# Patient Record
Sex: Female | Born: 1955 | Race: White | Hispanic: No | Marital: Married | Smoking: Current every day smoker
Health system: Southern US, Community
[De-identification: ages and names within clinical notes are randomized; demographics above are authoritative.]

## PROBLEM LIST (undated history)

## (undated) DIAGNOSIS — Q210 Ventricular septal defect: Secondary | ICD-10-CM

## (undated) DIAGNOSIS — J449 Chronic obstructive pulmonary disease, unspecified: Secondary | ICD-10-CM

## (undated) DIAGNOSIS — I480 Paroxysmal atrial fibrillation: Secondary | ICD-10-CM

## (undated) DIAGNOSIS — I10 Essential (primary) hypertension: Secondary | ICD-10-CM

## (undated) DIAGNOSIS — Z720 Tobacco use: Secondary | ICD-10-CM

## (undated) HISTORY — DX: Paroxysmal atrial fibrillation: I48.0

## (undated) HISTORY — DX: Tobacco use: Z72.0

## (undated) HISTORY — DX: Ventricular septal defect: Q21.0

## (undated) HISTORY — DX: Essential (primary) hypertension: I10

---

## 1997-12-09 HISTORY — PX: ABDOMINAL HYSTERECTOMY: SHX81

## 2004-08-03 DIAGNOSIS — K2100 Gastro-esophageal reflux disease with esophagitis, without bleeding: Secondary | ICD-10-CM | POA: Insufficient documentation

## 2006-12-09 DIAGNOSIS — I480 Paroxysmal atrial fibrillation: Secondary | ICD-10-CM

## 2006-12-09 HISTORY — DX: Paroxysmal atrial fibrillation: I48.0

## 2007-01-16 DIAGNOSIS — Z87891 Personal history of nicotine dependence: Secondary | ICD-10-CM | POA: Insufficient documentation

## 2008-01-01 DIAGNOSIS — I10 Essential (primary) hypertension: Secondary | ICD-10-CM | POA: Insufficient documentation

## 2008-01-21 ENCOUNTER — Encounter: Payer: Self-pay | Admitting: Cardiovascular Disease

## 2008-02-02 ENCOUNTER — Encounter: Payer: Self-pay | Admitting: Cardiovascular Disease

## 2008-03-23 ENCOUNTER — Encounter: Payer: Self-pay | Admitting: Cardiovascular Disease

## 2011-09-24 ENCOUNTER — Encounter: Payer: Self-pay | Admitting: Cardiovascular Disease

## 2011-10-04 DIAGNOSIS — I739 Peripheral vascular disease, unspecified: Secondary | ICD-10-CM | POA: Insufficient documentation

## 2011-11-01 ENCOUNTER — Emergency Department: Payer: Self-pay | Admitting: Internal Medicine

## 2012-01-25 ENCOUNTER — Encounter: Payer: Self-pay | Admitting: Cardiovascular Disease

## 2012-01-25 ENCOUNTER — Emergency Department: Payer: Self-pay | Admitting: Emergency Medicine

## 2012-01-25 LAB — CBC WITH DIFFERENTIAL/PLATELET
Basophil #: 0 10*3/uL (ref 0.0–0.1)
Eosinophil #: 0.1 10*3/uL (ref 0.0–0.7)
HGB: 14 g/dL (ref 12.0–16.0)
Lymphocyte #: 3.2 10*3/uL (ref 1.0–3.6)
Lymphocyte %: 39.6 %
MCHC: 34.6 g/dL (ref 32.0–36.0)
Monocyte #: 0.7 10*3/uL (ref 0.0–0.7)
Neutrophil #: 4.1 10*3/uL (ref 1.4–6.5)
RDW: 12.7 % (ref 11.5–14.5)

## 2012-01-25 LAB — COMPREHENSIVE METABOLIC PANEL
Anion Gap: 7 (ref 7–16)
Bilirubin,Total: 0.3 mg/dL (ref 0.2–1.0)
Chloride: 104 mmol/L (ref 98–107)
Co2: 30 mmol/L (ref 21–32)
Creatinine: 0.82 mg/dL (ref 0.60–1.30)
EGFR (African American): >60
EGFR (Non-African Amer.): >60
Osmolality: 281 (ref 275–301)
Potassium: 3.7 mmol/L (ref 3.5–5.1)
Sodium: 141 mmol/L (ref 136–145)

## 2012-01-29 ENCOUNTER — Ambulatory Visit (INDEPENDENT_AMBULATORY_CARE_PROVIDER_SITE_OTHER): Payer: PRIVATE HEALTH INSURANCE | Admitting: Cardiovascular Disease

## 2012-01-29 ENCOUNTER — Encounter: Payer: Self-pay | Admitting: Cardiovascular Disease

## 2012-01-29 DIAGNOSIS — I4891 Unspecified atrial fibrillation: Secondary | ICD-10-CM

## 2012-01-29 DIAGNOSIS — R5381 Other malaise: Secondary | ICD-10-CM

## 2012-01-29 DIAGNOSIS — R0602 Shortness of breath: Secondary | ICD-10-CM

## 2012-01-29 DIAGNOSIS — F17200 Nicotine dependence, unspecified, uncomplicated: Secondary | ICD-10-CM

## 2012-01-29 DIAGNOSIS — IMO0001 Reserved for inherently not codable concepts without codable children: Secondary | ICD-10-CM

## 2012-01-29 DIAGNOSIS — R5383 Other fatigue: Secondary | ICD-10-CM | POA: Insufficient documentation

## 2012-01-29 MED ORDER — METOPROLOL TARTRATE 50 MG PO TABS: 75.0000 mg | ORAL_TABLET | Freq: Two times a day (BID) | ORAL | Status: DC

## 2012-01-29 MED ORDER — DIGOXIN 250 MCG PO TABS: 250.0000 ug | ORAL_TABLET | Freq: Every day | ORAL | Status: DC

## 2012-01-29 NOTE — Assessment & Plan Note (Addendum)
She is in atrial fibrillation with RVR. Relatively asymptomatic. She did have general malaise this past weekend and went to the emergency room. We will start her on digoxin 0.5 mg today, 0.25 mg daily after that. We also suggested she increase her metoprolol tartrate to 50 mg b.i.d.. If blood pressure will tolerate, we can increase the metoprolol to a higher dose. We will also start her on pradaxa 150 mg b.i.d..  We will see her in one week for followup.

## 2012-01-29 NOTE — Patient Instructions (Addendum)
Please increase metoprolol to 50 mg twice a day Please start digoxin two pills today then one a day after that Start pradaxa 150 mg twice a day for stroke prevention (blood thinner)  Please call us if you have new issues that need to be addressed before your next appt.  Your physician wants you to follow-up in: 1 week on 02/05/12 at 1:45pm
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
## 2012-01-29 NOTE — Assessment & Plan Note (Signed)
Malaise and fatigue likely secondary to atrial fibrillation with RVR.  Today she is relatively asymptomatic and tolerating her rapid rhythm with no problems. No signs of heart failure.

## 2012-01-29 NOTE — Assessment & Plan Note (Signed)
We have encouraged her to continue to work on weaning her cigarettes and smoking cessation. She will continue to work on this and does not want any assistance with chantix.  

## 2012-01-31 ENCOUNTER — Telehealth: Payer: Self-pay | Admitting: Cardiovascular Disease

## 2012-01-31 ENCOUNTER — Other Ambulatory Visit: Payer: Self-pay

## 2012-01-31 DIAGNOSIS — I4891 Unspecified atrial fibrillation: Secondary | ICD-10-CM

## 2012-01-31 DIAGNOSIS — Z7901 Long term (current) use of anticoagulants: Secondary | ICD-10-CM

## 2012-01-31 MED ORDER — WARFARIN SODIUM 5 MG PO TABS: 5.0000 mg | ORAL_TABLET | Freq: Every day | ORAL | Status: AC

## 2012-01-31 NOTE — Telephone Encounter (Signed)
Patient called stating she wanted Dr.Gollan to prescribe coumadin.States she does not want to take pradaxa.Message fowarded to Dr.Gollan.

## 2012-01-31 NOTE — Telephone Encounter (Signed)
Pt wanting to start on coumadin instead of pradaxa wants to know if dr Mariah Milling would switch her.

## 2012-01-31 NOTE — Telephone Encounter (Signed)
Patient called was told ok with Dr.Gollan to start warfarin 5 mg daily.Stop pradaxa.Patient stated she never started pradaxa.Advised needs inr checked next wed 02/05/12.Will send to [place] to manage.

## 2012-01-31 NOTE — Telephone Encounter (Signed)
Does she want our office to manage and check (may meed higher cost?) versus PMD to manage? Would start warfarin 5 mg daily and we would check it next week on Wednesday. Maybe Jamesetta Orleans could work her in on coumadin clinic?

## 2012-02-03 DIAGNOSIS — I4891 Unspecified atrial fibrillation: Secondary | ICD-10-CM | POA: Insufficient documentation

## 2012-02-03 DIAGNOSIS — Z9229 Personal history of other drug therapy: Secondary | ICD-10-CM | POA: Insufficient documentation

## 2012-02-03 DIAGNOSIS — Z7901 Long term (current) use of anticoagulants: Secondary | ICD-10-CM | POA: Insufficient documentation

## 2012-02-03 NOTE — Telephone Encounter (Signed)
Addended by: Migdalia Dk on: 02/03/2012 09:28 AM   Modules accepted: Orders

## 2012-02-03 NOTE — Telephone Encounter (Signed)
Called spoke with pt, pt just started on Coumadin and has an appt in [place] office with Dr Mariah Milling on 02/05/12 at 1:45.  Made Coumadin appt on 02/05/12 at 2:30pm after Dr Mariah Milling appt.

## 2012-02-05 ENCOUNTER — Ambulatory Visit (INDEPENDENT_AMBULATORY_CARE_PROVIDER_SITE_OTHER): Payer: PRIVATE HEALTH INSURANCE

## 2012-02-05 ENCOUNTER — Encounter: Payer: Self-pay | Admitting: Cardiovascular Disease

## 2012-02-05 ENCOUNTER — Ambulatory Visit (INDEPENDENT_AMBULATORY_CARE_PROVIDER_SITE_OTHER): Payer: PRIVATE HEALTH INSURANCE | Admitting: Cardiovascular Disease

## 2012-02-05 VITALS — BP 120/90 | HR 113 | Ht 65.0 in | Wt 172.0 lb

## 2012-02-05 DIAGNOSIS — Z7901 Long term (current) use of anticoagulants: Secondary | ICD-10-CM

## 2012-02-05 DIAGNOSIS — F17200 Nicotine dependence, unspecified, uncomplicated: Secondary | ICD-10-CM

## 2012-02-05 DIAGNOSIS — R5381 Other malaise: Secondary | ICD-10-CM

## 2012-02-05 DIAGNOSIS — IMO0001 Reserved for inherently not codable concepts without codable children: Secondary | ICD-10-CM

## 2012-02-05 DIAGNOSIS — I4891 Unspecified atrial fibrillation: Secondary | ICD-10-CM

## 2012-02-05 LAB — POCT INR: INR: 2.2

## 2012-02-05 NOTE — Patient Instructions (Addendum)
You are doing well. Please take a  whole pill of digoxin once a day  Please call us if you have new issues that need to be addressed before your next appt.  Your physician wants you to follow-up in: 1 months.  You will receive a reminder letter in the mail two months in advance. If you don't receive a letter, please call our office to schedule the follow-up appointment.

## 2012-02-05 NOTE — Patient Instructions (Signed)

## 2012-02-05 NOTE — Assessment & Plan Note (Signed)
We have encouraged her to continue to work on weaning her cigarettes and smoking cessation. She will continue to work on this and does not want any assistance with chantix.  

## 2012-02-05 NOTE — Assessment & Plan Note (Signed)
Started on warfarin last week. Tolerating higher dose metoprolol though continues to have fatigue. We have encouraged her to take full dose digoxin for rate control. Call us for any side effects.  Continue warfarin with therapeutic INR greater than 2 for at least 4 weeks and then cardioversion.

## 2012-02-05 NOTE — Assessment & Plan Note (Signed)
Uncertain if fatigue is secondary to beta blocker or atrial fibrillation. She does not want to change the beta blocker to calcium channel blocker at this time.
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
## 2012-02-05 NOTE — Assessment & Plan Note (Signed)
She did not want to take pradaxa and we have started warfarin.

## 2012-02-12 ENCOUNTER — Ambulatory Visit (INDEPENDENT_AMBULATORY_CARE_PROVIDER_SITE_OTHER): Payer: PRIVATE HEALTH INSURANCE

## 2012-02-12 DIAGNOSIS — Z7901 Long term (current) use of anticoagulants: Secondary | ICD-10-CM

## 2012-02-12 DIAGNOSIS — I4891 Unspecified atrial fibrillation: Secondary | ICD-10-CM

## 2012-02-19 ENCOUNTER — Ambulatory Visit (INDEPENDENT_AMBULATORY_CARE_PROVIDER_SITE_OTHER): Payer: PRIVATE HEALTH INSURANCE

## 2012-02-19 DIAGNOSIS — I4891 Unspecified atrial fibrillation: Secondary | ICD-10-CM

## 2012-02-19 DIAGNOSIS — Z7901 Long term (current) use of anticoagulants: Secondary | ICD-10-CM

## 2012-02-19 LAB — POCT INR: INR: 3.2

## 2012-02-26 ENCOUNTER — Ambulatory Visit (INDEPENDENT_AMBULATORY_CARE_PROVIDER_SITE_OTHER): Payer: PRIVATE HEALTH INSURANCE

## 2012-02-26 DIAGNOSIS — Z7901 Long term (current) use of anticoagulants: Secondary | ICD-10-CM

## 2012-02-26 DIAGNOSIS — I4891 Unspecified atrial fibrillation: Secondary | ICD-10-CM

## 2012-03-04 ENCOUNTER — Ambulatory Visit (INDEPENDENT_AMBULATORY_CARE_PROVIDER_SITE_OTHER): Payer: PRIVATE HEALTH INSURANCE | Admitting: Cardiovascular Disease

## 2012-03-04 ENCOUNTER — Encounter: Payer: Self-pay | Admitting: Cardiovascular Disease

## 2012-03-04 ENCOUNTER — Ambulatory Visit (INDEPENDENT_AMBULATORY_CARE_PROVIDER_SITE_OTHER): Payer: PRIVATE HEALTH INSURANCE

## 2012-03-04 VITALS — BP 118/80 | HR 89 | Ht 65.0 in | Wt 172.4 lb

## 2012-03-04 DIAGNOSIS — I4891 Unspecified atrial fibrillation: Secondary | ICD-10-CM

## 2012-03-04 DIAGNOSIS — Z7901 Long term (current) use of anticoagulants: Secondary | ICD-10-CM

## 2012-03-04 DIAGNOSIS — F17200 Nicotine dependence, unspecified, uncomplicated: Secondary | ICD-10-CM

## 2012-03-04 MED ORDER — AMIODARONE HCL 200 MG PO TABS: 200.0000 mg | ORAL_TABLET | Freq: Two times a day (BID) | ORAL | Status: AC

## 2012-03-04 NOTE — Assessment & Plan Note (Signed)
INR has been therapeutic for one month
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
## 2012-03-04 NOTE — Patient Instructions (Signed)
You are doing well. Please start amiodarone 200 mg twice a day  Come in for an ekg in 7 to 10 days If you are still in atrial fibrillation, we can set up the cardioversion  Please call us if you have new issues that need to be addressed before your next appt.  Your physician wants you to follow-up in: 1 months.  You will receive a reminder letter in the mail two months in advance. If you don't receive a letter, please call our office to schedule the follow-up appointment.

## 2012-03-04 NOTE — Assessment & Plan Note (Signed)
We have encouraged her to continue to work on weaning her cigarettes and smoking cessation. She will continue to work on this and does not want any assistance with chantix.  

## 2012-03-04 NOTE — Assessment & Plan Note (Signed)
We will start amiodarone 200 mg twice a day in an effort to convert her rhythm to sinus. We will check an EKG in 7-10 days. If she continues to be in atrial fibrillation, we will consider cardioversion.

## 2012-03-11 ENCOUNTER — Ambulatory Visit (INDEPENDENT_AMBULATORY_CARE_PROVIDER_SITE_OTHER): Payer: PRIVATE HEALTH INSURANCE

## 2012-03-11 DIAGNOSIS — Z7901 Long term (current) use of anticoagulants: Secondary | ICD-10-CM

## 2012-03-11 DIAGNOSIS — I4891 Unspecified atrial fibrillation: Secondary | ICD-10-CM

## 2012-03-18 ENCOUNTER — Ambulatory Visit (INDEPENDENT_AMBULATORY_CARE_PROVIDER_SITE_OTHER): Payer: PRIVATE HEALTH INSURANCE

## 2012-03-18 DIAGNOSIS — I4891 Unspecified atrial fibrillation: Secondary | ICD-10-CM

## 2012-03-18 DIAGNOSIS — Z7901 Long term (current) use of anticoagulants: Secondary | ICD-10-CM

## 2012-03-18 LAB — POCT INR: INR: 2.7

## 2012-03-25 ENCOUNTER — Telehealth: Payer: Self-pay

## 2012-03-25 ENCOUNTER — Ambulatory Visit (INDEPENDENT_AMBULATORY_CARE_PROVIDER_SITE_OTHER): Payer: PRIVATE HEALTH INSURANCE

## 2012-03-25 DIAGNOSIS — Z7901 Long term (current) use of anticoagulants: Secondary | ICD-10-CM

## 2012-03-25 DIAGNOSIS — I4891 Unspecified atrial fibrillation: Secondary | ICD-10-CM

## 2012-03-25 NOTE — Telephone Encounter (Signed)
We could try flecainide 50 mg BID now Follow up with me on 5/1 If still in atrial fib, could always increase to 100 mg BId or proceed straight to DCCV. If she wants we can proceed straight to cardioversion without any medication. There is a chance it will be less successful. We could try though.  May need to be done again after starting medication.

## 2012-03-25 NOTE — Telephone Encounter (Signed)
Pt saw Dr Mariah Milling on 03/04/12 he rx Amiodarone 200mg  BID for pt and ordered an EKG in 7-10 days, then DCCV if pt was still in afib.  Pt has been hesitant to start Amiodarone, I have been checking pt's INR weekly and pt remains therapeutic. Encouraged pt to try Amiodarone at each of the last few visits, advising her that it helps to ensure a successful DCCV, and can in fact chemically convert rhythm to NSR on it's own.  Pt does not want to take Amiodarone secondary to side effects explained by pharmacist at the drug store. Pt wants to know if she can proceed with DCCV without taking Amiodarone.  Pt has planned f/u with Dr Mariah Milling on 04/08/12.  Please advise

## 2012-04-08 ENCOUNTER — Ambulatory Visit: Payer: PRIVATE HEALTH INSURANCE | Admitting: Cardiovascular Disease

## 2012-04-10 ENCOUNTER — Encounter: Payer: Self-pay | Admitting: Cardiovascular Disease

## 2012-07-01 NOTE — Telephone Encounter (Signed)
Have attempted to contact pt multiple times, pt has not returned phone calls.  LMOM TCB pt is past due for Coumadin follow-up.  Pt is most likely off Coumadin, she was only given 30 tablets with 1 refill on 01/31/12 by [place].  We are not and have not refilled pt's Coumadin since 01/31/12.  Called pt's pharmacy [facility] pt last filled Coumadin on 06/29/12 rx by Earvin Hansen, also filled on 05/18/12 rx by same provider.  Pt filled refill on 04/10/12 rx by Enzo Montgomery. Pt has obviously switched providers Earvin Hansen is a pharmD in [place].

## 2012-07-08 ENCOUNTER — Ambulatory Visit: Payer: Self-pay | Admitting: Cardiovascular Disease

## 2012-07-08 DIAGNOSIS — I4891 Unspecified atrial fibrillation: Secondary | ICD-10-CM

## 2012-07-08 DIAGNOSIS — Z7901 Long term (current) use of anticoagulants: Secondary | ICD-10-CM

## 2012-08-23 ENCOUNTER — Other Ambulatory Visit: Payer: Self-pay | Admitting: Cardiovascular Disease

## 2012-08-24 ENCOUNTER — Other Ambulatory Visit: Payer: Self-pay | Admitting: *Deleted

## 2012-08-24 MED ORDER — METOPROLOL TARTRATE 50 MG PO TABS: 75.0000 mg | ORAL_TABLET | Freq: Two times a day (BID) | ORAL | Status: DC

## 2012-08-24 NOTE — Telephone Encounter (Signed)
Refilled Metoprolol

## 2012-09-25 DIAGNOSIS — B351 Tinea unguium: Secondary | ICD-10-CM | POA: Insufficient documentation

## 2012-09-25 DIAGNOSIS — L03039 Cellulitis of unspecified toe: Secondary | ICD-10-CM | POA: Insufficient documentation

## 2013-01-24 ENCOUNTER — Other Ambulatory Visit: Payer: Self-pay | Admitting: Cardiovascular Disease

## 2013-09-13 ENCOUNTER — Other Ambulatory Visit: Payer: Self-pay | Admitting: *Deleted

## 2013-09-13 ENCOUNTER — Other Ambulatory Visit: Payer: Self-pay | Admitting: Cardiovascular Disease

## 2013-09-13 MED ORDER — METOPROLOL TARTRATE 50 MG PO TABS: 75.0000 mg | ORAL_TABLET | Freq: Two times a day (BID) | ORAL | Status: AC

## 2013-09-13 NOTE — Telephone Encounter (Signed)
Refilled Metoprolol sent to [facility].

## 2014-01-13 IMAGING — CR DG CHEST 2V
1 series · 2 of 2 positions shown · non-contrast
Comparison: none

REASON FOR EXAM: rib pain, Don Lolito Onacram
COMMENTS:

[Series 1: w chest pa · 0.14mm/px · 2 of 2 slices shown]
[im 1/2]
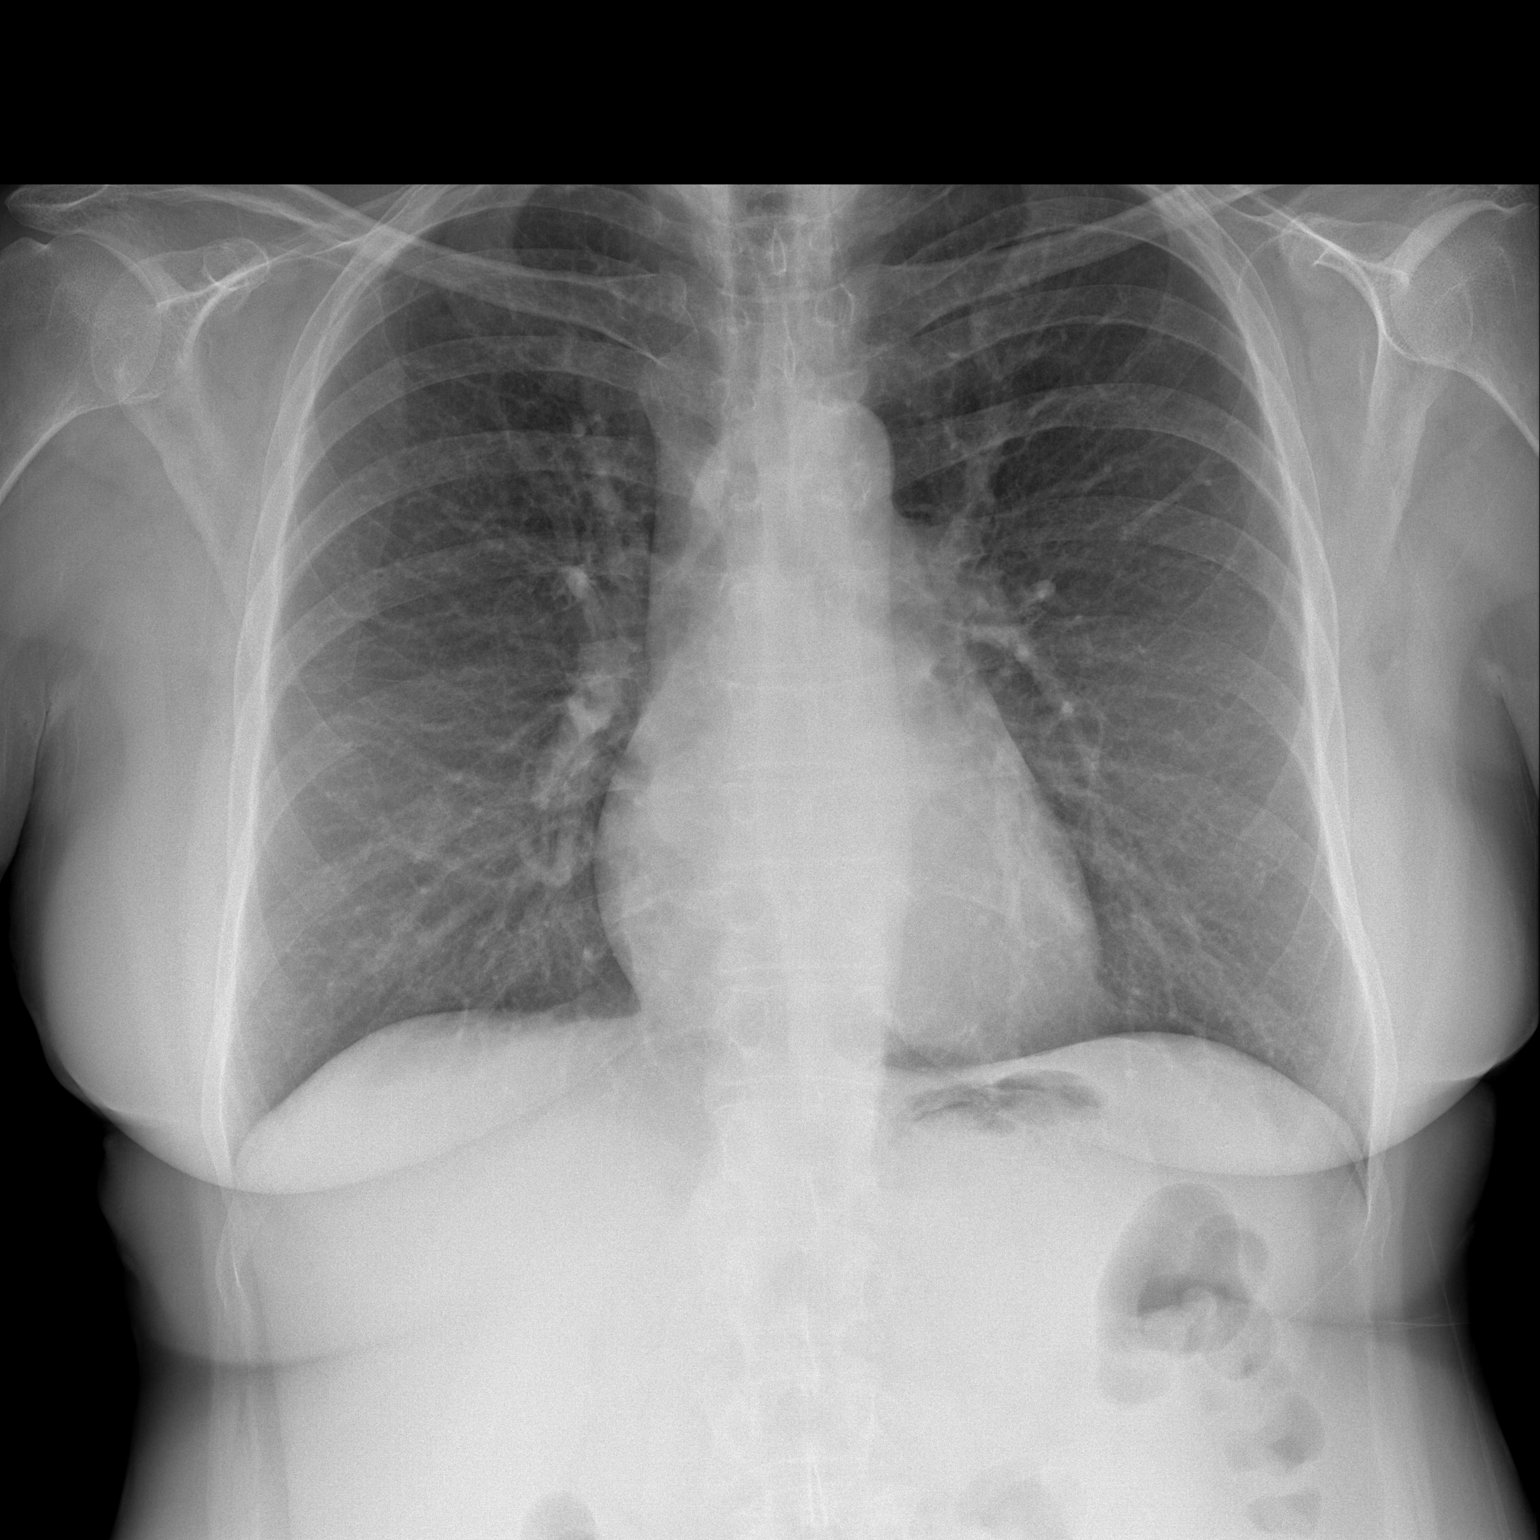
[im 2/2]
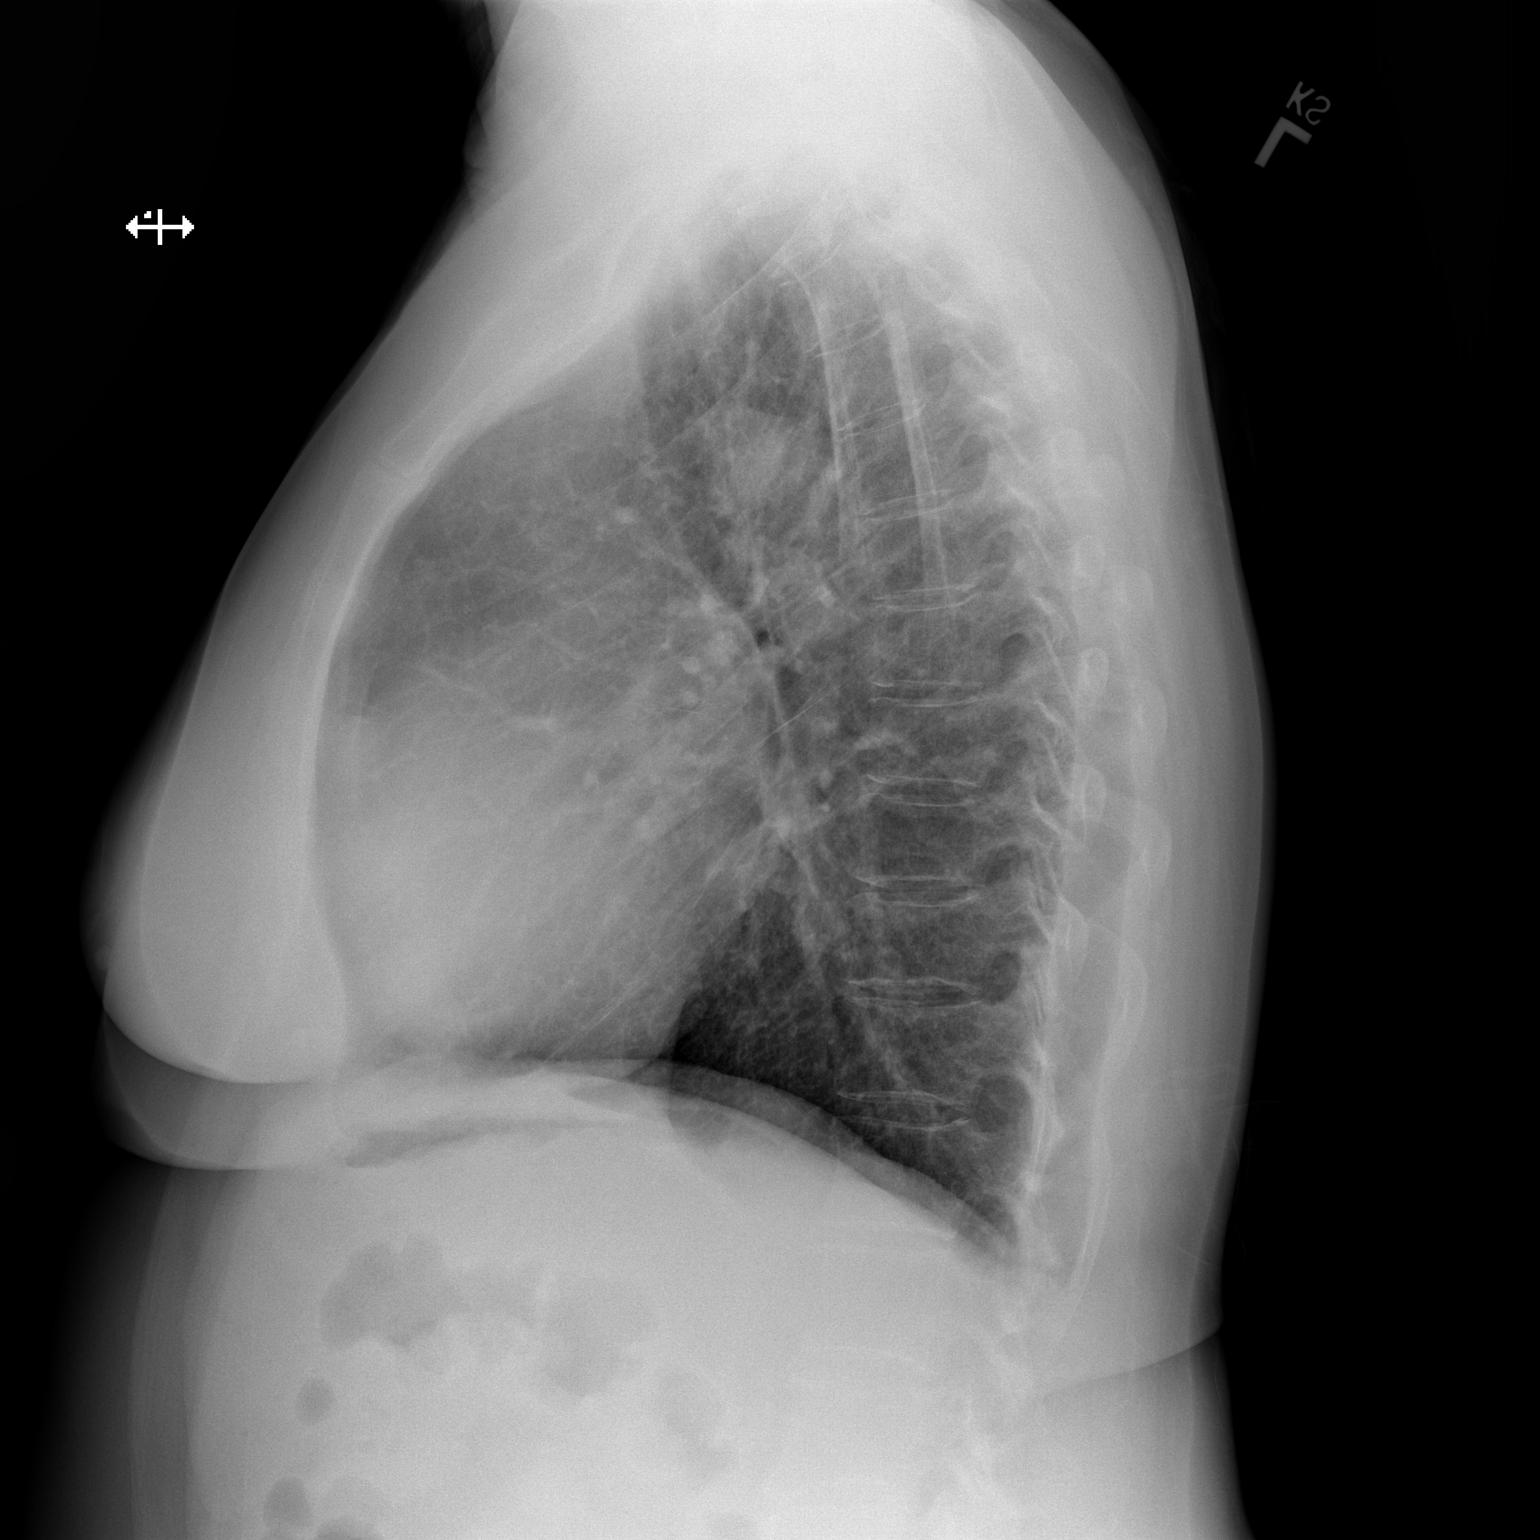

[2 of 2 positions shown; findings below may reference images not displayed]

PROCEDURE:     DXR - DXR CHEST PA (OR AP) AND LATERAL  - January 25, 2012  [DATE]

RESULT:     There is no previous exam for comparison.

The lungs are clear. The heart and pulmonary vessels are normal. The bony
and mediastinal structures are unremarkable. There is no effusion. There is
no pneumothorax or evidence of congestive failure.
IMPRESSION: No acute cardiopulmonary disease.

## 2014-03-27 ENCOUNTER — Emergency Department: Payer: Self-pay | Admitting: Emergency Medicine

## 2014-03-27 LAB — PROTIME-INR
INR: 1.8
PROTHROMBIN TIME: 20.9 s — AB (ref 11.5–14.7)

## 2015-04-24 DIAGNOSIS — M25559 Pain in unspecified hip: Secondary | ICD-10-CM | POA: Insufficient documentation

## 2015-04-24 DIAGNOSIS — G44029 Chronic cluster headache, not intractable: Secondary | ICD-10-CM | POA: Insufficient documentation

## 2015-07-05 DIAGNOSIS — J449 Chronic obstructive pulmonary disease, unspecified: Secondary | ICD-10-CM | POA: Insufficient documentation

## 2016-03-15 IMAGING — CT CT HEAD WITHOUT CONTRAST
1 series · 16 of 30 positions shown, 20 images · non-contrast
Comparison: None.

CLINICAL DATA: Head injury.  Anti coagulation.

EXAM:
CT HEAD WITHOUT CONTRAST
TECHNIQUE: Contiguous axial images were obtained from the base of the skull
through the vertex without intravenous contrast.

[Series 2: head wo · axial · 0.44mm/px · z∈[+362,+488]mm · 16 of 32 slices shown, 20 images]
[im 2/32  brain]
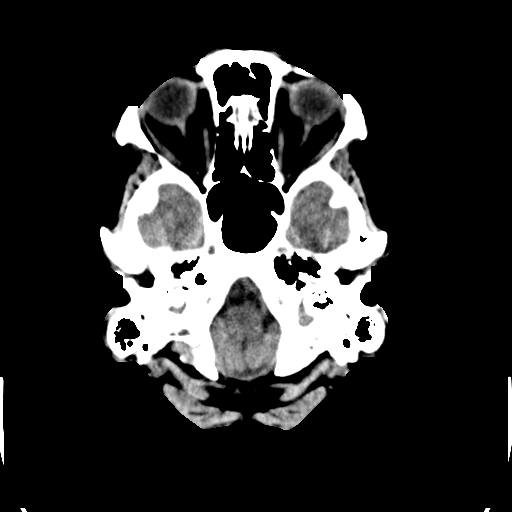
[im 2/32  bone]
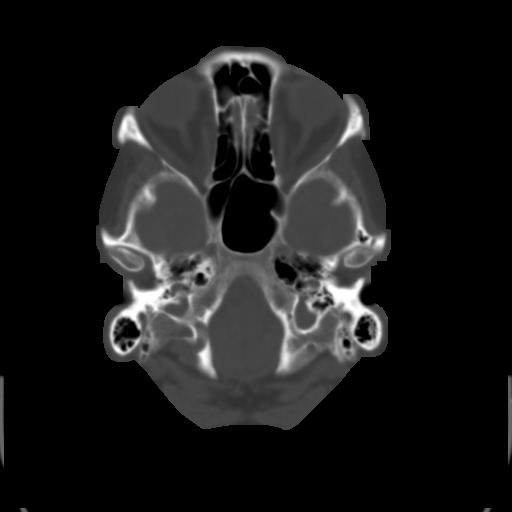
[im 4/32  brain]
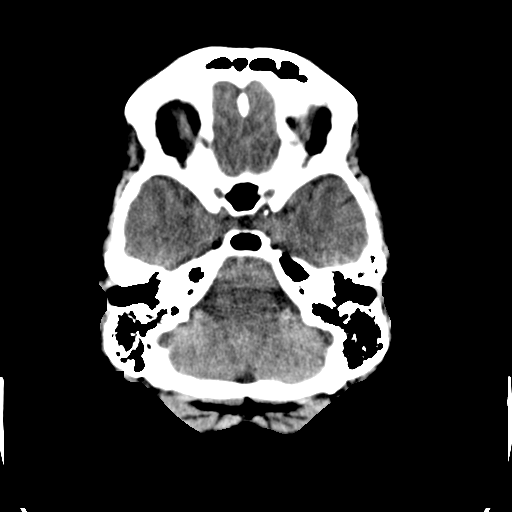
[im 6/32  brain]
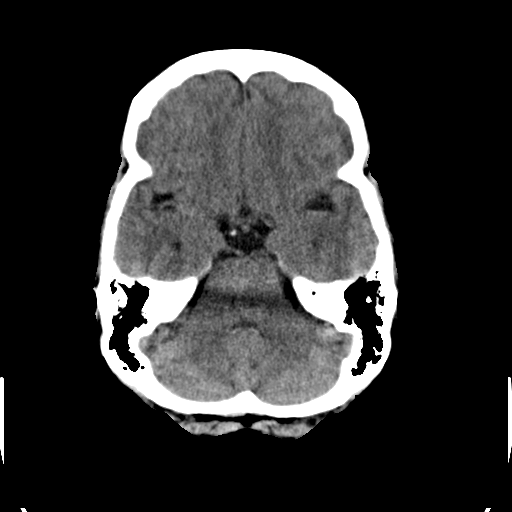
[im 8/32  brain]
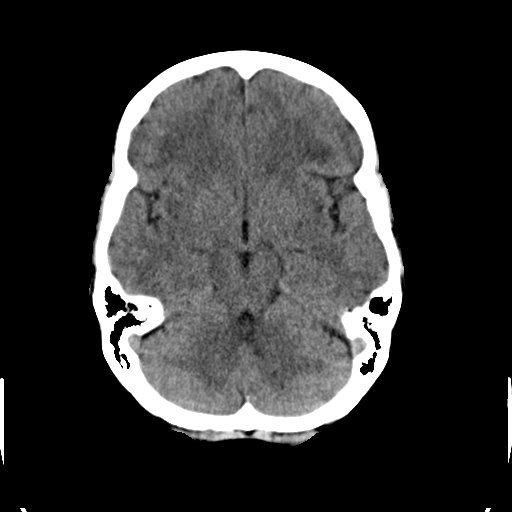
[im 9/32  brain]
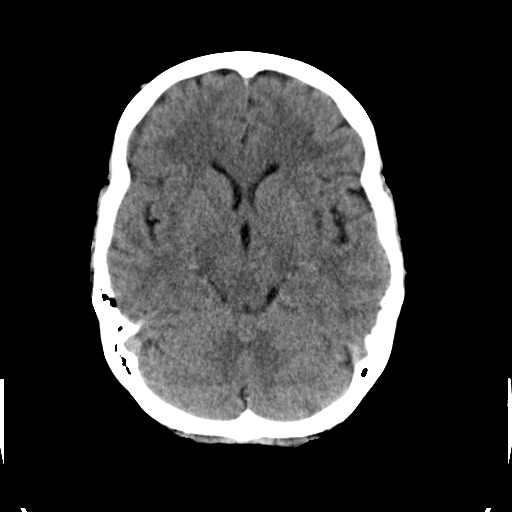
[im 9/32  bone]
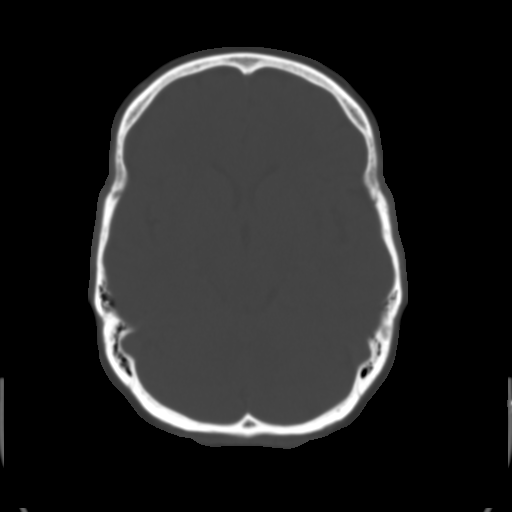
[im 11/32  brain]
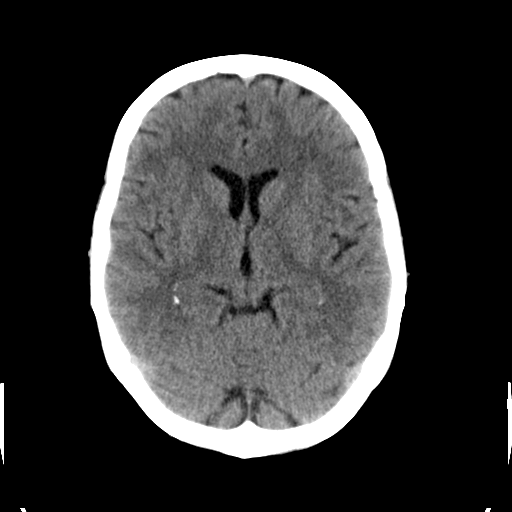
[im 13/32  brain]
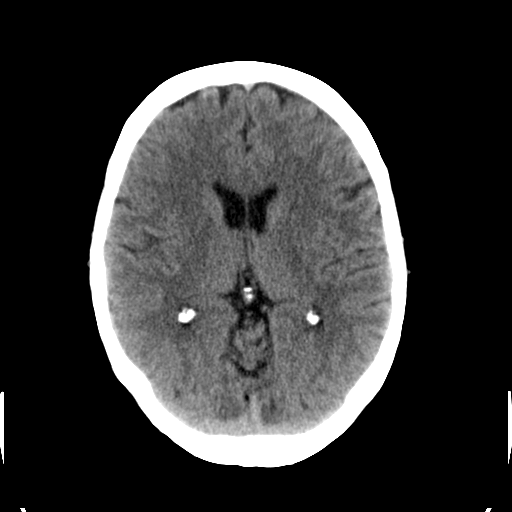
[im 15/32  brain]
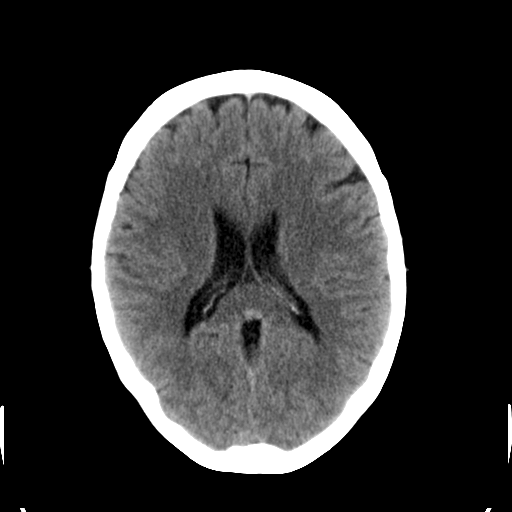
[im 17/32  brain]
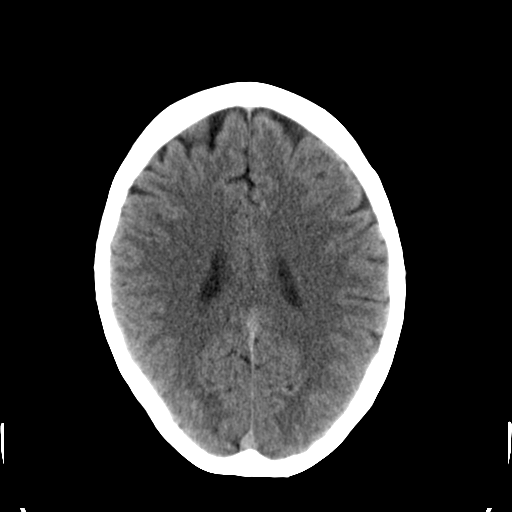
[im 17/32  bone]
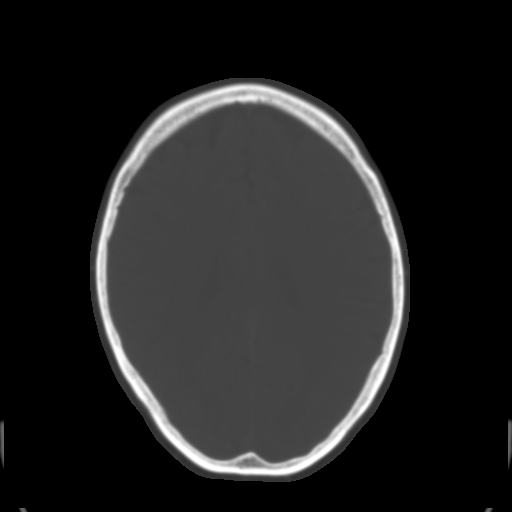
[im 19/32  brain]
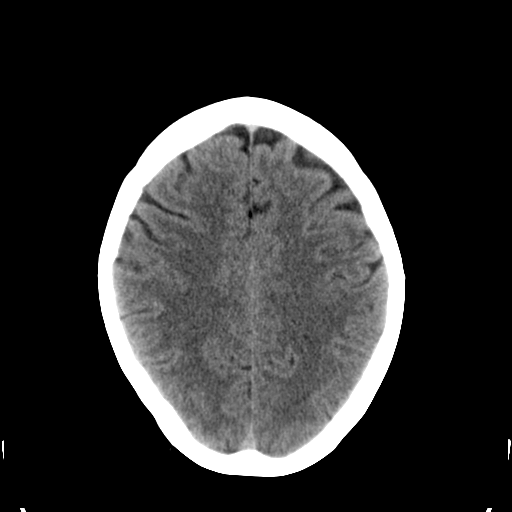
[im 21/32  brain]
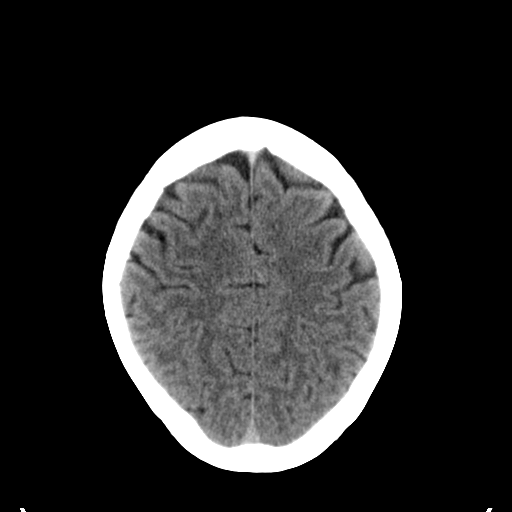
[im 23/32  brain]
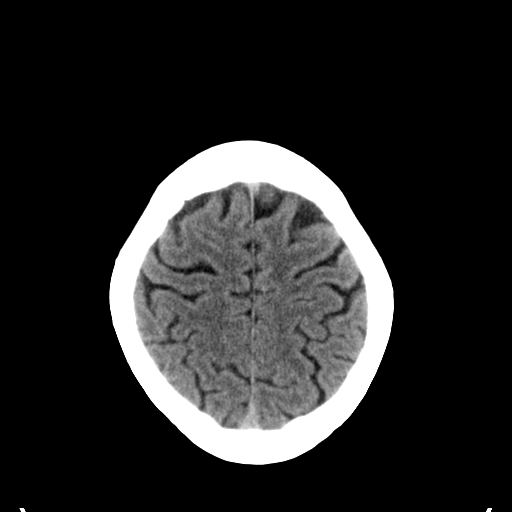
[im 24/32  brain]
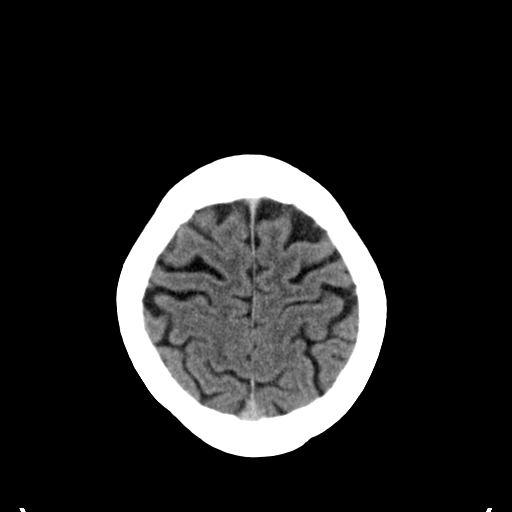
[im 24/32  bone]
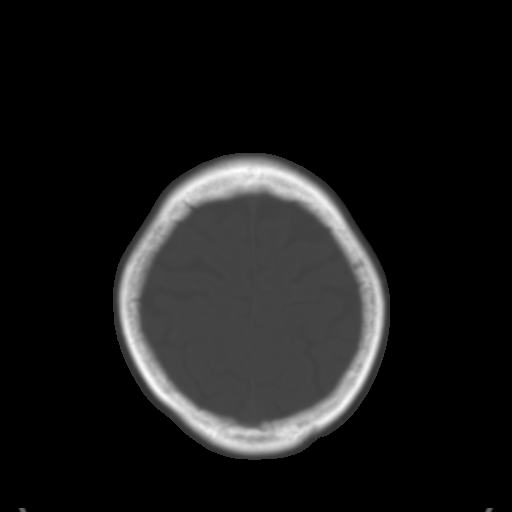
[im 26/32  brain]
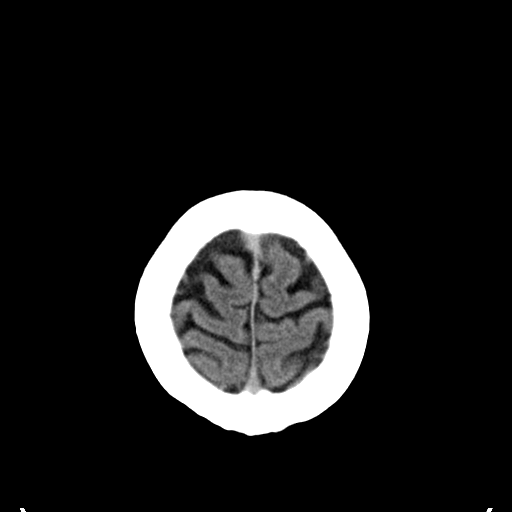
[im 28/32  brain]
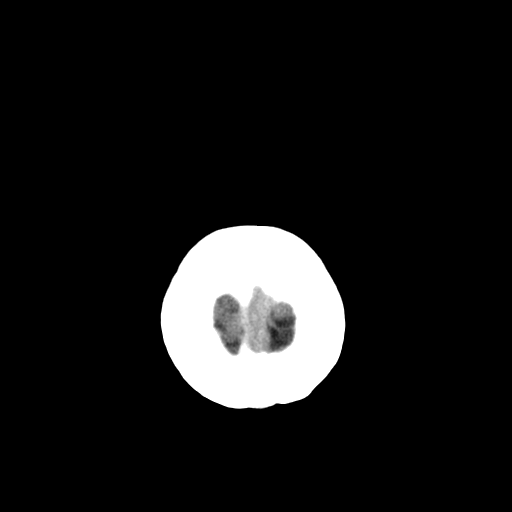
[im 30/32  brain]
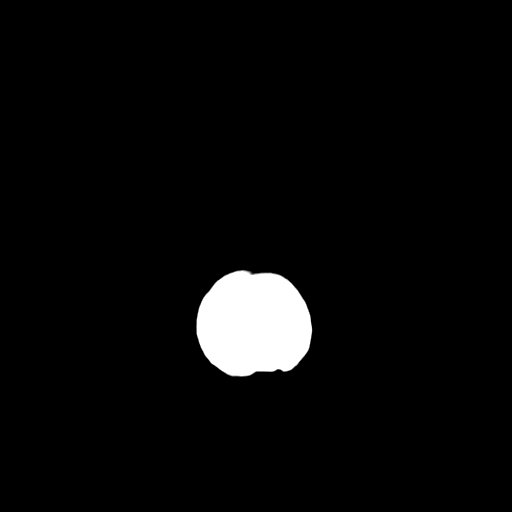

[16 of 30 positions shown; findings below may reference images not displayed]

FINDINGS: The brainstem, cerebellum, cerebral peduncles, thalamus, basal
ganglia, basilar cisterns, and ventricular system appear within
normal limits. No intracranial hemorrhage, mass lesion, or acute
CVA.
IMPRESSION: No significant abnormality identified.

## 2017-08-09 ENCOUNTER — Encounter: Payer: Self-pay | Admitting: Emergency Medicine

## 2017-08-09 ENCOUNTER — Emergency Department
Admission: EM | Admit: 2017-08-09 | Discharge: 2017-08-09 | Disposition: A | Discharge status: Home / Self Care | Payer: BLUE CROSS/BLUE SHIELD | Attending: Emergency Medicine | Admitting: Emergency Medicine

## 2017-08-09 DIAGNOSIS — I10 Essential (primary) hypertension: Secondary | ICD-10-CM | POA: Insufficient documentation

## 2017-08-09 DIAGNOSIS — F17210 Nicotine dependence, cigarettes, uncomplicated: Secondary | ICD-10-CM | POA: Diagnosis not present

## 2017-08-09 DIAGNOSIS — R202 Paresthesia of skin: Secondary | ICD-10-CM | POA: Insufficient documentation

## 2017-08-09 DIAGNOSIS — Z79899 Other long term (current) drug therapy: Secondary | ICD-10-CM | POA: Diagnosis not present

## 2017-08-09 DIAGNOSIS — Z7901 Long term (current) use of anticoagulants: Secondary | ICD-10-CM | POA: Diagnosis not present

## 2017-08-09 DIAGNOSIS — R200 Anesthesia of skin: Secondary | ICD-10-CM | POA: Diagnosis present

## 2017-08-09 LAB — COMPREHENSIVE METABOLIC PANEL
ALBUMIN: 3.5 g/dL (ref 3.5–5.0)
ALT: 17 U/L (ref 14–54)
ANION GAP: 7 (ref 5–15)
AST: 25 U/L (ref 15–41)
Alkaline Phosphatase: 90 U/L (ref 38–126)
BILIRUBIN TOTAL: 0.5 mg/dL (ref 0.3–1.2)
BUN: 8 mg/dL (ref 6–20)
CHLORIDE: 97 mmol/L — AB (ref 101–111)
CO2: 31 mmol/L (ref 22–32)
Calcium: 8.8 mg/dL — ABNORMAL LOW (ref 8.9–10.3)
Creatinine, Ser: 0.61 mg/dL (ref 0.44–1.00)
GFR calc Af Amer: >60 mL/min (ref >60)
GFR calc non Af Amer: >60 mL/min (ref >60)
GLUCOSE: 176 mg/dL — AB (ref 65–99)
POTASSIUM: 3.3 mmol/L — AB (ref 3.5–5.1)
Sodium: 135 mmol/L (ref 135–145)
TOTAL PROTEIN: 7.5 g/dL (ref 6.5–8.1)

## 2017-08-09 LAB — CBC
HEMATOCRIT: 44.7 % (ref 35.0–47.0)
Hemoglobin: 15.7 g/dL (ref 12.0–16.0)
MCH: 32.9 pg (ref 26.0–34.0)
MCHC: 35.3 g/dL (ref 32.0–36.0)
MCV: 93.2 fL (ref 80.0–100.0)
PLATELETS: 318 10*3/uL (ref 150–440)
RBC: 4.79 MIL/uL (ref 3.80–5.20)
RDW: 12.4 % (ref 11.5–14.5)
WBC: 9.7 10*3/uL (ref 3.6–11.0)

## 2017-08-09 LAB — PROTIME-INR
INR: 1.72
Prothrombin Time: 20 seconds — ABNORMAL HIGH (ref 11.4–15.2)

## 2017-08-09 LAB — TROPONIN I

## 2017-08-09 LAB — DIGOXIN LEVEL: DIGOXIN LVL: 1 ng/mL (ref 0.8–2.0)

## 2017-08-09 NOTE — ED Triage Notes (Signed)
Pt states was out paying a bill and while there had an episode of numbess that started in left low abd, up her stomach into her throat and then felt weak on her left side. Drove to her daughter's house and she called ems. Symptoms resolved by time the ems came.

## 2017-08-09 NOTE — ED Provider Notes (Signed)
[facility] Emergency Department Provider Note  Time seen: 12:11 PM  I have reviewed the triage vital signs and the nursing notes.   HISTORY  Chief Complaint Numbness    HPI Nicole Werner is a 60 y.o. female With a past medical history of hypertension, atrial fibrillation on warfarin, and digoxin, presents to the emergency department for medical evaluation. According to the patient around 11:00 she had a strange sensation of numbness/tingling in her left abdomen, and when somewhat into her throat. But denied any weakness or numbness of her arm or leg. Denies any confusion, headache or slurred speech. Patient called EMS however her symptoms resolve before EMS arrived. Patient states currently she feels normal, denies any symptoms at this time. Denies any sensation of weakness or numbness anywhere in her body. Denies any pain at any point.denies any chest pain or trouble breathing.  Past Medical History:  Diagnosis Date  . Hypertension   . Paroxysmal A-fib (HCC) 2008  . Tobacco abuse   . VSD (ventricular septal defect)    History of @ 18 months.  . VSD (ventricular septal defect), muscular    in the past.    Patient Active Problem List   Diagnosis Date Noted  . Atrial fibrillation (HCC) 02/03/2012  . Long term (current) use of anticoagulants 02/03/2012  . A-fib (HCC) 01/29/2012  . Malaise and fatigue 01/29/2012  . Smoking 01/29/2012    Past Surgical History:  Procedure Laterality Date  . ABDOMINAL HYSTERECTOMY  1999    Prior to Admission medications   Medication Sig Start Date End Date Taking? Authorizing Provider  DIGOX 0.25 MG tablet take 1 tablet by mouth once daily 01/24/13   Antonieta Iba, MD  digoxin (LANOXIN) 0.25 MG tablet Take 250 mcg by mouth daily.  01/29/12 01/28/13  Antonieta Iba, MD  lansoprazole (PREVACID) 30 MG capsule Take 30 mg by mouth daily.    [provider]  metoprolol (LOPRESSOR) 50 MG tablet Take 1.5 tablets (75  mg total) by mouth 2 (two) times daily. 09/13/13   Antonieta Iba, MD  Multiple Vitamin (MULTIVITAMIN) tablet Take 1 tablet by mouth daily.    [provider]  warfarin (COUMADIN) 5 MG tablet Take 1 tablet (5 mg total) by mouth daily. 01/31/12 01/30/13  Antonieta Iba, MD    Allergies  Allergen Reactions  . Sulfa Antibiotics Nausea And Vomiting    Severe headache    Family History  Problem Relation Age of Onset  . Arrhythmia Father        A-fib  . Heart failure Father   . Hyperlipidemia Father     Social History Social History  Substance Use Topics  . Smoking status: Current Every Day Smoker    Packs/day: 1.00    Years: 45.00    Types: Cigarettes  . Smokeless tobacco: Not on file  . Alcohol use No    Review of Systems Constitutional: Negative for fever. Cardiovascular: Negative for chest pain. Respiratory: Negative for shortness of breath. Gastrointestinal: Negative for abdominal pain, vomiting or diarrhea. States she felt a numbness type sensation inside of her left abdomen. Genitourinary: Negative for dysuria. Musculoskeletal: Negative for back pain. Skin: tick bite 3 weeks ago, but no rash. Neurological: Negative for headache All other ROS negative  ____________________________________________   PHYSICAL EXAM:  VITAL SIGNS: ED Triage Vitals [08/09/17 1202]  Enc Vitals Group     BP (!) 141/87     Pulse      Resp  Temp 98.1 F (36.7 C)     Temp Source Oral     SpO2      Weight 153 lb (69.4 kg)     Height 5\' 5"  (1.651 m)     Head Circumference      Peak Flow      Pain Score      Pain Loc      Pain Edu?      Excl. in GC?     Constitutional: Alert and oriented. Well appearing and in no distress. Eyes: Normal exam ENT   Head: Normocephalic and atraumatic   Mouth/Throat: Mucous membranes are moist. Cardiovascular: irregular rhythm, rate around 90 bpm. Respiratory: Normal respiratory effort without tachypnea nor retractions.  Breath sounds are clear Gastrointestinal: Soft and nontender. No distention.  Musculoskeletal: Nontender with normal range of motion in all extremities. No lower extremity tenderness or edema. Neurologic:  Normal speech and language. No gross focal neurologic deficits  Skin:  Skin is warm, dry and intact.  Psychiatric: Mood and affect are normal.   ____________________________________________    EKG  EKG reviewed and interpreted by myself shows atrial fibrillation at 78 bpm, narrow QRS, normal axis, normal intervals, nonspecific ST changes. The patient does have inferolateral T-wave inversions appear unchanged since 2013  ____________________________________________    INITIAL IMPRESSION / ASSESSMENT AND PLAN / ED COURSE  Pertinent labs & imaging results that were available during my care of the patient were reviewed by me and considered in my medical decision making (see chart for details).  patient presents to the emergency department with vague sensation of numbness/tingling inside of her left abdomen and somewhat into her throat. Patient states her symptoms quickly resolved and she denies any complaints at this time. Denies any weakness or numbness of any arm or leg, headache confusion or slurred speech at any point. Denies any chest pain or trouble breathing at any point. the patient states she is completely symptom-free at this time. Currently the patient has a normal exam. Denies any complaints. We will check labs including a warfarin and digoxin level. Patient states earlier this week her warfarin level was high and she was told to discontinue use 3 days she restarted yesterday.  patient's workup is normal. Patient continues to appear very well has no complaints still. I discussed with the patient obtaining plenty of rest, drinking plenty fluids and following up with her doctor on Tuesday and returning for any changes. Patient  agreeable. ____________________________________________   FINAL CLINICAL IMPRESSION(S) / ED DIAGNOSES  paresthesia    Minna AntisPaduchowski, Princella Jaskiewicz, MD 08/09/17 1401

## 2017-08-12 DIAGNOSIS — G459 Transient cerebral ischemic attack, unspecified: Secondary | ICD-10-CM | POA: Insufficient documentation

## 2018-03-27 DIAGNOSIS — E119 Type 2 diabetes mellitus without complications: Secondary | ICD-10-CM | POA: Insufficient documentation

## 2018-07-24 ENCOUNTER — Encounter: Payer: Self-pay | Admitting: Emergency Medicine

## 2018-07-24 ENCOUNTER — Emergency Department: Payer: BLUE CROSS/BLUE SHIELD

## 2018-07-24 ENCOUNTER — Other Ambulatory Visit: Payer: Self-pay

## 2018-07-24 ENCOUNTER — Emergency Department
Admission: EM | Admit: 2018-07-24 | Discharge: 2018-07-24 | Disposition: A | Discharge status: Home / Self Care | Payer: BLUE CROSS/BLUE SHIELD | Attending: Emergency Medicine | Admitting: Emergency Medicine

## 2018-07-24 DIAGNOSIS — Y929 Unspecified place or not applicable: Secondary | ICD-10-CM | POA: Insufficient documentation

## 2018-07-24 DIAGNOSIS — F17210 Nicotine dependence, cigarettes, uncomplicated: Secondary | ICD-10-CM | POA: Insufficient documentation

## 2018-07-24 DIAGNOSIS — J449 Chronic obstructive pulmonary disease, unspecified: Secondary | ICD-10-CM | POA: Insufficient documentation

## 2018-07-24 DIAGNOSIS — Z7901 Long term (current) use of anticoagulants: Secondary | ICD-10-CM | POA: Diagnosis not present

## 2018-07-24 DIAGNOSIS — I10 Essential (primary) hypertension: Secondary | ICD-10-CM | POA: Diagnosis not present

## 2018-07-24 DIAGNOSIS — Y999 Unspecified external cause status: Secondary | ICD-10-CM | POA: Diagnosis not present

## 2018-07-24 DIAGNOSIS — Y939 Activity, unspecified: Secondary | ICD-10-CM | POA: Diagnosis not present

## 2018-07-24 DIAGNOSIS — R51 Headache: Secondary | ICD-10-CM | POA: Insufficient documentation

## 2018-07-24 DIAGNOSIS — S0990XA Unspecified injury of head, initial encounter: Secondary | ICD-10-CM

## 2018-07-24 DIAGNOSIS — W228XXA Striking against or struck by other objects, initial encounter: Secondary | ICD-10-CM | POA: Insufficient documentation

## 2018-07-24 DIAGNOSIS — Z79899 Other long term (current) drug therapy: Secondary | ICD-10-CM | POA: Diagnosis not present

## 2018-07-24 HISTORY — DX: Chronic obstructive pulmonary disease, unspecified: J44.9
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
## 2018-07-24 NOTE — ED Notes (Signed)
Pt left before receiving discharge instructions after PA-C saw them.
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
## 2021-12-11 ENCOUNTER — Other Ambulatory Visit: Payer: Self-pay

## 2021-12-11 ENCOUNTER — Ambulatory Visit (INDEPENDENT_AMBULATORY_CARE_PROVIDER_SITE_OTHER): Payer: Medicare HMO | Admitting: Podiatry

## 2021-12-11 ENCOUNTER — Encounter: Payer: Self-pay | Admitting: Podiatry

## 2021-12-11 DIAGNOSIS — B351 Tinea unguium: Secondary | ICD-10-CM

## 2021-12-11 DIAGNOSIS — M79675 Pain in left toe(s): Secondary | ICD-10-CM | POA: Diagnosis not present

## 2021-12-11 DIAGNOSIS — M79674 Pain in right toe(s): Secondary | ICD-10-CM | POA: Diagnosis not present

## 2021-12-22 NOTE — Progress Notes (Signed)
° °  SUBJECTIVE Patient presents to office today complaining of elongated, thickened nails that cause pain while ambulating in shoes.  Patient is unable to trim their own nails. Patient is here for further evaluation and treatment.  Past Medical History:  Diagnosis Date   COPD (chronic obstructive pulmonary disease) ([place])    Hypertension    Paroxysmal A-fib ([place]) 2008   Tobacco abuse    VSD (ventricular septal defect)    History of @ 18 months.   VSD (ventricular septal defect), muscular    in the past.    OBJECTIVE General Patient is awake, alert, and oriented x 3 and in no acute distress. Derm Skin is dry and supple bilateral. Negative open lesions or macerations. Remaining integument unremarkable. Nails are tender, long, thickened and dystrophic with subungual debris, consistent with onychomycosis, 1-5 bilateral. No signs of infection noted. Vasc  DP and PT pedal pulses palpable bilaterally. Temperature gradient within normal limits.  Neuro Epicritic and protective threshold sensation grossly intact bilaterally.  Musculoskeletal Exam No symptomatic pedal deformities noted bilateral. Muscular strength within normal limits.  ASSESSMENT 1.  Pain due to onychomycosis of toenails both  PLAN OF CARE 1. Patient evaluated today.  2. Instructed to maintain good pedal hygiene and foot care.  3. Mechanical debridement of nails 1-5 bilaterally performed using a nail nipper. Filed with dremel without incident.  4.  Appointment with our nurse for laser antifungal treatment  5.  Return to clinic in 3 mos.    Edrick Kins, DPM [facility]  Dr. Edrick Kins, DPM    [address], [place] [address]                Office [phone]  Fax [phone]

## 2021-12-31 ENCOUNTER — Ambulatory Visit (INDEPENDENT_AMBULATORY_CARE_PROVIDER_SITE_OTHER): Payer: Medicare HMO

## 2021-12-31 ENCOUNTER — Other Ambulatory Visit: Payer: Self-pay

## 2021-12-31 DIAGNOSIS — B351 Tinea unguium: Secondary | ICD-10-CM

## 2021-12-31 DIAGNOSIS — M79675 Pain in left toe(s): Secondary | ICD-10-CM

## 2021-12-31 DIAGNOSIS — M79674 Pain in right toe(s): Secondary | ICD-10-CM

## 2021-12-31 NOTE — Patient Instructions (Signed)

## 2021-12-31 NOTE — Progress Notes (Signed)
Patient presents today for the 1st laser treatment. Diagnosed with mycotic nail infection by Dr. Logan Bores.   Toenail most affected bilateral hallux.  All other systems are negative.  Nails were filed thin. Laser therapy was administered to bilateral 1-5 toenails  and patient tolerated the treatment well. All safety precautions were in place.    Follow up in 4 weeks for laser # 2.  Please take picture of nails at next visit to document visual progress

## 2022-02-01 ENCOUNTER — Other Ambulatory Visit: Payer: Self-pay

## 2022-02-01 ENCOUNTER — Ambulatory Visit (INDEPENDENT_AMBULATORY_CARE_PROVIDER_SITE_OTHER): Payer: Self-pay

## 2022-02-01 DIAGNOSIS — M79675 Pain in left toe(s): Secondary | ICD-10-CM

## 2022-02-01 DIAGNOSIS — M79674 Pain in right toe(s): Secondary | ICD-10-CM

## 2022-02-01 DIAGNOSIS — B351 Tinea unguium: Secondary | ICD-10-CM

## 2022-02-01 NOTE — Progress Notes (Signed)
Patient presents today for the 2nd laser treatment. Diagnosed with mycotic nail infection by Dr. Logan Bores.   Toenail most affected bilateral hallux.  All other systems are negative.  Nails were filed thin. Laser therapy was administered to bilateral 1-5 toenails  and patient tolerated the treatment well. All safety precautions were in place.    Follow up in 4 weeks for laser # 3.  Please take picture of nails at next visit to document visual progress

## 2022-03-22 ENCOUNTER — Other Ambulatory Visit: Payer: Self-pay

## 2022-03-27 ENCOUNTER — Ambulatory Visit (INDEPENDENT_AMBULATORY_CARE_PROVIDER_SITE_OTHER): Payer: Self-pay

## 2022-03-27 DIAGNOSIS — M79674 Pain in right toe(s): Secondary | ICD-10-CM

## 2022-03-27 DIAGNOSIS — M79675 Pain in left toe(s): Secondary | ICD-10-CM

## 2022-03-27 DIAGNOSIS — B351 Tinea unguium: Secondary | ICD-10-CM

## 2022-03-27 NOTE — Progress Notes (Signed)
Patient presents today for the 3rd laser treatment. Diagnosed with mycotic nail infection by Dr. Logan Bores.  ? ?Toenail most affected bilateral hallux. ? ?All other systems are negative. ? ?Nails were filed thin. Laser therapy was administered to bilateral 1-5 toenails  and patient tolerated the treatment well. All safety precautions were in place.  ? ? ?Follow up in 6 weeks for laser # 4. ? ?Please take picture of nails at next visit to document visual progress  ?

## 2022-05-10 ENCOUNTER — Ambulatory Visit (INDEPENDENT_AMBULATORY_CARE_PROVIDER_SITE_OTHER): Payer: Medicare HMO | Admitting: *Deleted

## 2022-05-10 DIAGNOSIS — B351 Tinea unguium: Secondary | ICD-10-CM

## 2022-05-10 DIAGNOSIS — M79675 Pain in left toe(s): Secondary | ICD-10-CM

## 2022-05-10 DIAGNOSIS — M79674 Pain in right toe(s): Secondary | ICD-10-CM

## 2022-05-10 NOTE — Progress Notes (Signed)
Patient presents today for the 4th laser treatment. Diagnosed with mycotic nail infection by Dr. Amalia Hailey.   Toenail most affected bilateral hallux. All toenails appear to be pincer nails, bilateral hallux are particularly sensitive/painful.  All other systems are negative.  Nails were filed thin. Laser therapy was administered to bilateral 1-5 toenails  and patient tolerated the treatment well. All safety precautions were in place.    Follow up in 6 weeks for laser # 5.  Please take picture of nails at next visit to document visual progress

## 2022-06-14 ENCOUNTER — Ambulatory Visit (INDEPENDENT_AMBULATORY_CARE_PROVIDER_SITE_OTHER): Payer: Self-pay | Admitting: Podiatry

## 2022-06-14 DIAGNOSIS — B351 Tinea unguium: Secondary | ICD-10-CM

## 2022-06-14 NOTE — Progress Notes (Signed)
Patient presents today for the 5th laser treatment. Diagnosed with mycotic nail infection by Dr. Logan Bores.    Toenail most affected bilateral hallux.   All other systems are negative.   Nails were filed thin. Laser therapy was administered to bilateral 1-5 toenails  and patient tolerated the treatment well. All safety precautions were in place.      Follow up in 10 weeks for laser # 6.

## 2022-08-02 ENCOUNTER — Ambulatory Visit (INDEPENDENT_AMBULATORY_CARE_PROVIDER_SITE_OTHER): Payer: Medicare HMO

## 2022-08-02 DIAGNOSIS — B351 Tinea unguium: Secondary | ICD-10-CM

## 2022-08-02 NOTE — Progress Notes (Signed)
Patient presents today for the 6th laser treatment. Diagnosed with mycotic nail infection by Dr. Logan Bores.    Toenail most affected bilateral hallux.   All other systems are negative.   Nails were filed thin. Laser therapy was administered to bilateral 1-5 toenails  and patient tolerated the treatment well. All safety precautions were in place.      Patient has completed the recommended laser treatments. He will follow up with Dr. Logan Bores in 3 months to evaluate progress.

## 2022-08-02 NOTE — Patient Instructions (Signed)

## 2022-08-30 ENCOUNTER — Ambulatory Visit: Payer: Medicare HMO | Admitting: Podiatry

## 2022-08-30 DIAGNOSIS — B351 Tinea unguium: Secondary | ICD-10-CM

## 2022-08-30 NOTE — Progress Notes (Signed)
   Chief Complaint  Patient presents with   Follow-up    Follow-up for laser with dr. Amalia Hailey bilateral feet.    SUBJECTIVE Patient presents to office today for follow-up evaluation of fungal nail infection especially to the bilateral great toes.  Patient states that there is some improvement especially to the right great toenail.  Presenting for follow-up treatment and evaluation  Past Medical History:  Diagnosis Date   COPD (chronic obstructive pulmonary disease) ([place])    Hypertension    Paroxysmal A-fib ([place]) 2008   Tobacco abuse    VSD (ventricular septal defect)    History of @ 18 months.   VSD (ventricular septal defect), muscular    in the past.    OBJECTIVE General Patient is awake, alert, and oriented x 3 and in no acute distress. Derm S hyperkeratotic dystrophic discolored nails noted to the bilateral great toes.  The lesser digits do not demonstrate any onychomycosis of the nail plates Vasc  DP and PT pedal pulses palpable bilaterally. Temperature gradient within normal limits.  Neuro Epicritic and protective threshold sensation grossly intact bilaterally.  Musculoskeletal Exam no deformity noted  ASSESSMENT 1.  Pain due to onychomycosis of toenails both  PLAN OF CARE 1. Patient evaluated today.  2.  Mechanical debridement of the bilateral great toenails was performed today using a nail nipper without incident or bleeding. 3.  For now we are going to hold off on continue laser treatments.  We will pursue topical treatment for the next 6 months 4.  Tolcylen antifungal topical dispensed at checkout 5.  Return to clinic 6 months   Edrick Kins, DPM [facility]  Dr. Edrick Kins, DPM    [address], [place] [address]                Office [phone]  Fax [phone]

## 2023-02-28 ENCOUNTER — Ambulatory Visit: Payer: Medicare HMO | Admitting: Podiatry

## 2023-03-11 ENCOUNTER — Ambulatory Visit: Payer: Medicare HMO | Admitting: Podiatry

## 2023-03-11 DIAGNOSIS — M79675 Pain in left toe(s): Secondary | ICD-10-CM | POA: Diagnosis not present

## 2023-03-11 DIAGNOSIS — M79674 Pain in right toe(s): Secondary | ICD-10-CM | POA: Diagnosis not present

## 2023-03-11 DIAGNOSIS — B351 Tinea unguium: Secondary | ICD-10-CM

## 2023-03-11 NOTE — Progress Notes (Signed)
   No chief complaint on file.   SUBJECTIVE Patient presents to office today complaining of elongated, thickened nails that cause pain while ambulating in shoes.  Patient is unable to trim their own nails. Patient is here for further evaluation and treatment.  Past Medical History:  Diagnosis Date   COPD (chronic obstructive pulmonary disease) ([employer])    Hypertension    Paroxysmal A-fib ([place]) 2008   Tobacco abuse    VSD (ventricular septal defect)    History of @ 18 months.   VSD (ventricular septal defect), muscular    in the past.    Allergies  Allergen Reactions   Losartan Cough    Cough   Sulfa Antibiotics Nausea And Vomiting    Severe headache   Amlodipine     Fatigue, muscle aches, LE edema   Sulfasalazine Nausea And Vomiting    Severe headache     OBJECTIVE General Patient is awake, alert, and oriented x 3 and in no acute distress. Derm Skin is dry and supple bilateral. Negative open lesions or macerations. Remaining integument unremarkable. Nails are tender, long, thickened and dystrophic with subungual debris, consistent with onychomycosis, 1-5 bilateral. No signs of infection noted. Vasc  DP and PT pedal pulses palpable bilaterally. Temperature gradient within normal limits.  Neuro Epicritic and protective threshold sensation grossly intact bilaterally.  Musculoskeletal Exam No symptomatic pedal deformities noted bilateral. Muscular strength within normal limits.  ASSESSMENT 1.  Pain due to onychomycosis of toenails both  PLAN OF CARE 1. Patient evaluated today.  2. Instructed to maintain good pedal hygiene and foot care.  3. Mechanical debridement of nails 1-5 bilaterally performed using a nail nipper. Filed with dremel without incident.  4. Return to clinic in 3 mos.    Edrick Kins, DPM [facility]  Dr. Edrick Kins, DPM    [address], [place] [address]                Office [phone]  Fax [phone]

## 2023-06-10 ENCOUNTER — Ambulatory Visit: Payer: Medicare HMO | Admitting: Podiatry

## 2023-06-10 DIAGNOSIS — B351 Tinea unguium: Secondary | ICD-10-CM

## 2023-06-10 DIAGNOSIS — E119 Type 2 diabetes mellitus without complications: Secondary | ICD-10-CM | POA: Diagnosis not present

## 2023-06-10 DIAGNOSIS — M79674 Pain in right toe(s): Secondary | ICD-10-CM

## 2023-06-10 DIAGNOSIS — M79675 Pain in left toe(s): Secondary | ICD-10-CM | POA: Diagnosis not present

## 2023-06-10 NOTE — Progress Notes (Signed)
   Chief Complaint  Patient presents with   Nail Problem    Routine foot care, nail trim     SUBJECTIVE Patient PMHx T2DM last A1c 11/19/2022 was 6.9.  Presents to office today complaining of elongated, thickened nails that cause pain while ambulating in shoes.  Patient is unable to trim their own nails.  Also presenting for annual diabetic foot exam.  Patient is here for further evaluation and treatment.  Past Medical History:  Diagnosis Date   COPD (chronic obstructive pulmonary disease) (HCC)    Hypertension    Paroxysmal A-fib (HCC) 2008   Tobacco abuse    VSD (ventricular septal defect)    History of @ 18 months.   VSD (ventricular septal defect), muscular    in the past.    Allergies  Allergen Reactions   Losartan Cough    Cough   Sulfa Antibiotics Nausea And Vomiting    Severe headache   Amlodipine     Fatigue, muscle aches, LE edema   Sulfasalazine Nausea And Vomiting    Severe headache     OBJECTIVE General Patient is awake, alert, and oriented x 3 and in no acute distress. Derm Skin is dry and supple bilateral. Negative open lesions or macerations. Remaining integument unremarkable. Nails are tender, long, thickened and dystrophic with subungual debris, consistent with onychomycosis, 1-5 bilateral. No signs of infection noted. Vasc  DP and PT pedal pulses palpable bilaterally. Temperature gradient within normal limits.  Neuro Epicritic and protective threshold sensation grossly intact bilaterally.  Musculoskeletal Exam No symptomatic pedal deformities noted bilateral. Muscular strength within normal limits.  ASSESSMENT 1.  Pain due to onychomycosis of toenails both 2.  Encounter for diabetic foot exam  PLAN OF CARE 1. Patient evaluated today.  Comprehensive diabetic foot exam performed today 2. Instructed to maintain good pedal hygiene and foot care.  3. Mechanical debridement of nails 1-5 bilaterally performed using a nail nipper. Filed with dremel without  incident.  4. Return to clinic in 3 mos.    Felecia Shelling, DPM [facility]  Dr. Felecia Shelling, DPM    [address] [place], [place] [address]                Office [phone]  Fax [phone]

## 2023-09-16 ENCOUNTER — Ambulatory Visit (INDEPENDENT_AMBULATORY_CARE_PROVIDER_SITE_OTHER): Payer: Medicare HMO | Admitting: Podiatry

## 2023-09-16 DIAGNOSIS — Z91199 Patient's noncompliance with other medical treatment and regimen due to unspecified reason: Secondary | ICD-10-CM

## 2023-09-16 NOTE — Progress Notes (Signed)
   Complete physical exam  Patient: Nicole Werner   DOB: 09/28/1999   67 y.o. Female  MRN: [id number]  Subjective:    No chief complaint on file.   Nicole Werner is a 67 y.o. female who presents today for a complete physical exam. She reports consuming a {diet types:17450} diet. {types:19826} She generally feels {DESC; WELL/FAIRLY WELL/POORLY:18703}. She reports sleeping {DESC; WELL/FAIRLY WELL/POORLY:18703}. She {does/does not:200015} have additional problems to discuss today.    Most recent fall risk assessment:    06/05/2022   10:42 AM  Fall Risk   Falls in the past year? 0  Number falls in past yr: 0  Injury with Fall? 0  Risk for fall due to : No Fall Risks  Follow up Falls evaluation completed     Most recent depression screenings:    06/05/2022   10:42 AM 04/26/2021   10:46 AM  PHQ 2/9 Scores  PHQ - 2 Score 0 0  PHQ- 9 Score 5     {VISON DENTAL STD PSA (Optional):27386}  {History (Optional):23778}  Patient Care Team: Jessup, Joy, NP as PCP - General (Nurse Practitioner)   Outpatient Medications Prior to Visit  Medication Sig   fluticasone (FLONASE) 50 MCG/ACT nasal spray Place 2 sprays into both nostrils in the morning and at bedtime. After 7 days, reduce to once daily.   norgestimate-ethinyl estradiol (SPRINTEC 28) 0.25-35 MG-MCG tablet Take 1 tablet by mouth daily.   Nystatin POWD Apply liberally to affected area 2 times per day   spironolactone (ALDACTONE) 100 MG tablet Take 1 tablet (100 mg total) by mouth daily.   No facility-administered medications prior to visit.    ROS        Objective:     There were no vitals taken for this visit. {Vitals History (Optional):23777}  Physical Exam   No results found for any visits on 07/11/22. {Show previous labs (optional):23779}    Assessment & Plan:    Routine Health Maintenance and Physical Exam  Immunization History  Administered Date(s) Administered   DTaP 12/12/1999, 02/07/2000,  04/17/2000, 01/01/2001, 07/17/2004   Hepatitis A 05/13/2008, 05/19/2009   Hepatitis B 09/29/1999, 11/06/1999, 04/17/2000   HiB (PRP-OMP) 12/12/1999, 02/07/2000, 04/17/2000, 01/01/2001   IPV 12/12/1999, 02/07/2000, 10/06/2000, 07/17/2004   Influenza,inj,Quad PF,6+ Mos 08/19/2014   Influenza-Unspecified 11/18/2012   MMR 10/06/2001, 07/17/2004   Meningococcal Polysaccharide 05/18/2012   Pneumococcal Conjugate-13 01/01/2001   Pneumococcal-Unspecified 04/17/2000, 07/01/2000   Tdap 05/18/2012   Varicella 10/06/2000, 05/13/2008    Health Maintenance  Topic Date Due   HIV Screening  Never done   Hepatitis C Screening  Never done   INFLUENZA VACCINE  07/09/2022   PAP-Cervical Cytology Screening  07/11/2022 (Originally 09/27/2020)   PAP SMEAR-Modifier  07/11/2022 (Originally 09/27/2020)   TETANUS/TDAP  07/11/2022 (Originally 05/18/2022)   HPV VACCINES  Discontinued   COVID-19 Vaccine  Discontinued    Discussed health benefits of physical activity, and encouraged her to engage in regular exercise appropriate for her age and condition.  Problem List Items Addressed This Visit   None Visit Diagnoses     Annual physical exam    -  Primary   Cervical cancer screening       Need for Tdap vaccination          No follow-ups on file.     Joy Jessup, NP
